# Patient Record
Sex: Male | Born: 1995 | Race: Black or African American | Hispanic: No | Marital: Single | Smoking: Never smoker
Health system: Southern US, Community
[De-identification: ages and names within clinical notes are randomized; demographics above are authoritative.]

## PROBLEM LIST (undated history)

## (undated) DIAGNOSIS — I10 Essential (primary) hypertension: Secondary | ICD-10-CM

---
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
## 2021-11-04 ENCOUNTER — Encounter: Payer: Self-pay | Admitting: Pain Medicine

## 2021-11-04 ENCOUNTER — Ambulatory Visit: Payer: No Typology Code available for payment source | Attending: Pain Medicine | Admitting: Pain Medicine

## 2021-11-04 VITALS — BP 128/101 | HR 83 | Temp 98.0°F | Resp 16 | Ht 71.0 in | Wt 143.0 lb

## 2021-11-04 DIAGNOSIS — R309 Painful micturition, unspecified: Secondary | ICD-10-CM

## 2021-11-04 DIAGNOSIS — M899 Disorder of bone, unspecified: Secondary | ICD-10-CM | POA: Diagnosis not present

## 2021-11-04 DIAGNOSIS — Z202 Contact with and (suspected) exposure to infections with a predominantly sexual mode of transmission: Secondary | ICD-10-CM

## 2021-11-04 DIAGNOSIS — G894 Chronic pain syndrome: Secondary | ICD-10-CM

## 2021-11-04 DIAGNOSIS — Z789 Other specified health status: Secondary | ICD-10-CM

## 2021-11-04 DIAGNOSIS — R52 Pain, unspecified: Secondary | ICD-10-CM

## 2021-11-04 DIAGNOSIS — R109 Unspecified abdominal pain: Secondary | ICD-10-CM

## 2021-11-04 DIAGNOSIS — N5082 Scrotal pain: Secondary | ICD-10-CM

## 2021-11-04 DIAGNOSIS — R102 Pelvic and perineal pain unspecified side: Secondary | ICD-10-CM

## 2021-11-04 DIAGNOSIS — Z79899 Other long term (current) drug therapy: Secondary | ICD-10-CM

## 2021-11-04 NOTE — Progress Notes (Signed)
Patient: Glenn Moyer  Service Category: E/M  Provider: Gaspar Cola, MD  DOB: 02-May-1995  DOS: 11/04/2021  Referring Provider: Chaney Moyer  MRN: [id number]  Setting: Ambulatory outpatient  PCP: Glenn Inches, PA-C  Type: New Patient  Specialty: Interventional Pain Management    Location: Office  Delivery: Face-to-face     Primary Reason(s) for Visit: Encounter for initial evaluation of one or more chronic problems (new to examiner) potentially causing chronic pain, and posing a threat to normal musculoskeletal function. (Level of risk: High) CC: Other (Genitalia ) and Abdominal Pain (Right and left pain )  HPI  Glenn Moyer is a 26 y.o. year old, male patient, who comes for the first time to our practice referred by Glenn Inches, PA-C for our initial evaluation of his chronic pain. 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 Today he comes in for evaluation of his Other (Genitalia ) and Abdominal Pain (Right and left pain )  Pain Assessment: Location:   Other (Comment) (genitalia) Radiating: denies Onset: More than a month ago Duration: Chronic pain Quality: Discomfort, Constant Severity: 2 /10 (subjective, self-reported pain score)  Effect on ADL: burning sensation after urination Timing: Constant Modifying factors: denies BP: (!) 128/101 (did not take BP medicine this a.m.)  HR: 83  Onset and Duration: Present longer than 3 months Cause of pain:  none listed Severity: Getting worse, NAS-11 at its worse: 7/10, NAS-11 at its best: 2/10, NAS-11 now: 3/10, and NAS-11 on the average: 3/10 Timing: Not influenced by the time of the day Aggravating Factors:  Intercourse (sex) and Motion Alleviating Factors:  none listed Associated Problems: Pain that wakes patient up Quality of Pain: Aching, Annoying, Burning, and Constant Previous Examinations or Tests: The patient denies test Previous Treatments: The patient denies treatments  According to the patient he contracted an STI/STD around January/February 2023.  According to the patient he was initially diagnosed with chlamydia and was given some antibiotics for it.  However the pain would not go away and follow-up evaluation revealed that he apparently had a secondary STI.  He was again provided with some antibiotics which according to him to he took for almost 2 months.  On follow-up he was told that he no longer had the infection but he continued to have pain on urination.  He describes this pain as a burning sensation.  He was placed on gabapentin 300 mg p.o. 3 times daily and he refers that he has been on it for approximately 1 to 2 months.  A testicular ultrasound revealed no lesions, no hernia, and did describe the scrotum as being within normal limits.  However it also showed a moderate left varicocele and a small right varicocele.  In addition it also showed a bilateral small hydroceles.  This ultrasound was done on 02/05/2021.  A CT of the abdomen and pelvis also done on 02/05/2021 without contrast revealed no acute abnormalities in the abdomen or pelvis.  In addition to the above, the patient does have a history of hypertension, migraines, decrease range of motion of his knee, and plantar fasciitis.  He was referred to our service by a urologist from the [place].  Glenn Moyer was informed that I continue to offer evaluations  and recommendations for medication management but I no longer take patients to write for their medications. I informed him that this visit is an evaluation only and that on the follow up appointment I will go over the my review of the case, the results of available tests, and assuming  that there are no contraindications, we will provide him with information about possible interventional pain management options. At that time he will have the opportunity to decide whether or not to proceed with those therapies. In the event that Glenn Moyer decides not to go with those options, or prefers to stay away from interventional therapies, this will conclude our involvement in the case.   Historic Controlled Substance Pharmacotherapy Review  PMP and historical list of controlled substances: None Most recently prescribed opioid analgesics:   None MME/day: 0 mg/day  Historical Monitoring: The patient  reports no history of drug use. List of prior UDS Testing: No results found for: "MDMA", "COCAINSCRNUR", "PCPSCRNUR", "PCPQUANT", "CANNABQUANT", "THCU", "ETH", "CBDTHCR", "D8THCCBX", "D9THCCBX" Historical Background Evaluation: [place] PMP: PDMP reviewed during this encounter. Review of the past 41-month conducted.             PMP NARX Score Report:  Narcotic: 000 Sedative: 000 Stimulant: 000 [place] Department of public safety, offender search: (Editor, commissioningInformation) Non-contributory Risk Assessment Profile: Aberrant behavior: None observed or detected today Risk factors for fatal opioid overdose: None identified today PMP NARX Overdose Risk Score: 000 Fatal overdose hazard ratio (HR): Calculation deferred Non-fatal overdose hazard ratio (HR): Calculation deferred Risk of opioid abuse or dependence: 0.7-3.0% with doses ? 36 MME/day and 6.1-26% with doses ? 120 MME/day. Substance use disorder (SUD) risk level: See below Personal History of Substance Abuse (SUD-Substance use disorder):  Alcohol: Negative  Illegal Drugs: Negative  Rx Drugs: Negative  ORT Risk Level calculation: Low Risk  Opioid Risk Tool - 11/04/21 1248       Family History of Substance Abuse   Alcohol Negative    Illegal Drugs Negative    Rx Drugs Negative      Personal History of Substance Abuse   Alcohol Negative     Illegal Drugs Negative    Rx Drugs Negative      Psychological Disease   Psychological Disease Negative    Depression Negative      Total Score   Opioid Risk Tool Scoring 0    Opioid Risk Interpretation Low Risk            ORT Scoring interpretation table:  Score <3 = Low Risk for SUD  Score between 4-7 = Moderate Risk for SUD  Score >8 = High Risk for Opioid Abuse   PHQ-2 Depression Scale:  Total score:    PHQ-2 Scoring interpretation table: (Score and probability of major depressive disorder)  Score 0 = No depression  Score 1 = 15.4% Probability  Score 2 = 21.1% Probability  Score 3 = 38.4% Probability  Score 4 = 45.5% Probability  Score 5 = 56.4% Probability  Score 6 = 78.6% Probability   PHQ-9 Depression Scale:  Total score:    PHQ-9 Scoring interpretation table:  Score 0-4 = No depression  Score 5-9 = Mild depression  Score 10-14 = Moderate depression  Score 15-19 = Moderately severe depression  Score 20-27 = Severe depression (2.4 times higher risk of SUD and 2.89 times higher risk of overuse)   Pharmacologic Plan: As per protocol, I have not taken over any controlled substance management, pending the results of ordered tests  and/or consults.            Initial impression: Pending review of available data and ordered tests.  Meds   Current Outpatient Medications:    amLODipine (NORVASC) 10 MG tablet, Take 10 mg by mouth daily., Disp: , Rfl:    SUMAtriptan (IMITREX) 25 MG tablet, Take 25 mg by mouth every 2 (two) hours as needed for migraine. May repeat in 2 hours if headache persists or recurs., Disp: , Rfl:   Imaging Review   Complexity Note: Imaging results reviewed.                         ROS  Cardiovascular: High blood pressure Pulmonary or Respiratory: No reported pulmonary signs or symptoms such as wheezing and difficulty taking a deep full breath (Asthma), difficulty blowing air out (Emphysema), coughing up mucus (Bronchitis), persistent dry  cough, or temporary stoppage of breathing during sleep Neurological: No reported neurological signs or symptoms such as seizures, abnormal skin sensations, urinary and/or fecal incontinence, being born with an abnormal open spine and/or a tethered spinal cord Psychological-Psychiatric: No reported psychological or psychiatric signs or symptoms such as difficulty sleeping, anxiety, depression, delusions or hallucinations (schizophrenial), mood swings (bipolar disorders) or suicidal ideations or attempts Gastrointestinal: No reported gastrointestinal signs or symptoms such as vomiting or evacuating blood, reflux, heartburn, alternating episodes of diarrhea and constipation, inflamed or scarred liver, or pancreas or irrregular and/or infrequent bowel movements Genitourinary: No reported renal or genitourinary signs or symptoms such as difficulty voiding or producing urine, peeing blood, non-functioning kidney, kidney stones, difficulty emptying the bladder, difficulty controlling the flow of urine, or chronic kidney disease Hematological: No reported hematological signs or symptoms such as prolonged bleeding, low or poor functioning platelets, bruising or bleeding easily, hereditary bleeding problems, low energy levels due to low hemoglobin or being anemic Endocrine: No reported endocrine signs or symptoms such as high or low blood sugar, rapid heart rate due to high thyroid levels, obesity or weight gain due to slow thyroid or thyroid disease Rheumatologic: No reported rheumatological signs and symptoms such as fatigue, joint pain, tenderness, swelling, redness, heat, stiffness, decreased range of motion, with or without associated rash Musculoskeletal: Negative for myasthenia gravis, muscular dystrophy, multiple sclerosis or malignant hyperthermia Work History: Working full time  Allergies  Mr. Kirchgessner has No Known Allergies.  Laboratory Chemistry Profile   Renal No results found for: "BUN",  "CREATININE", "LABCREA", "BCR", "GFR", "GFRAA", "GFRNONAA", "SPECGRAV", "PHUR", "PROTEINUR"   Electrolytes No results found for: "NA", "K", "CL", "CALCIUM", "MG", "PHOS"   Hepatic No results found for: "AST", "ALT", "ALBUMIN", "ALKPHOS", "AMYLASE", "LIPASE", "AMMONIA"   ID No results found for: "LYMEIGGIGMAB", "HIV", "SARSCOV2NAA", "STAPHAUREUS", "MRSAPCR", "HCVAB", "PREGTESTUR", "RMSFIGG", "QFVRPH1IGG", "QFVRPH2IGG"   Bone No results found for: "VD25OH", "VD125OH2TOT", "[id number]", "LE7517GY1", "25OHVITD1", "25OHVITD2", "25OHVITD3", "TESTOFREE", "TESTOSTERONE"   Endocrine No results found for: "GLUCOSE", "GLUCOSEU", "HGBA1C", "TSH", "FREET4", "TESTOFREE", "TESTOSTERONE", "SHBG", "ESTRADIOL", "ESTRADIOLPCT", "ESTRADIOLFRE", "LABPREG", "ACTH", "CRTSLPL", "UCORFRPERLTR", "UCORFRPERDAY", "CORTISOLBASE"   Neuropathy No results found for: "VITAMINB12", "FOLATE", "HGBA1C", "HIV"   CNS No results found for: "COLORCSF", "APPEARCSF", "RBCCOUNTCSF", "WBCCSF", "POLYSCSF", "LYMPHSCSF", "EOSCSF", "PROTEINCSF", "GLUCCSF", "JCVIRUS", "CSFOLI", "IGGCSF", "LABACHR", "ACETBL"   Inflammation (CRP: Acute  ESR: Chronic) No results found for: "CRP", "ESRSEDRATE", "LATICACIDVEN"   Rheumatology No results found for: "RF", "ANA", "LABURIC", "URICUR", "LYMEIGGIGMAB", "LYMEABIGMQN", "HLAB27"   Coagulation No results found for: "INR", "LABPROT", "APTT", "PLT", "DDIMER", "LABHEMA", "VITAMINK1", "AT3"   Cardiovascular No results found for: "BNP", "CKTOTAL", "CKMB", "TROPONINI", "HGB", "  HCT", "LABVMA", "EPIRU", "EPINEPH24HUR", "NOREPRU", "NOREPI24HUR", "DOPARU", "DOPAM24HRUR"   Screening No results found for: "SARSCOV2NAA", "COVIDSOURCE", "STAPHAUREUS", "MRSAPCR", "HCVAB", "HIV", "PREGTESTUR"   Cancer No results found for: "CEA", "CA125", "LABCA2"   Allergens No results found for: "ALMOND", "APPLE", "ASPARAGUS", "AVOCADO", "BANANA", "BARLEY", "BASIL", "BAYLEAF", "GREENBEAN", "LIMABEAN", "WHITEBEAN", "BEEFIGE",  "REDBEET", "BLUEBERRY", "BROCCOLI", "CABBAGE", "MELON", "CARROT", "CASEIN", "CASHEWNUT", "CAULIFLOWER", "CELERY"     Note: Lab results reviewed.  [place]  Drug: Mr. Crago  reports no history of drug use. Alcohol:  reports no history of alcohol use. Tobacco:  reports that he has never smoked. He has never used smokeless tobacco. Medical:  has no past medical history on file. Family: family history is not on file.  History reviewed. No pertinent surgical history. 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 In no apparent acute distress Vitals:   11/04/21 1242  BP: (!) 128/101  Pulse: 83  Resp: 16  Temp: 98 F (36.7 C)  TempSrc: Temporal  SpO2: 100%  Weight: 143 lb (64.9 kg)  Height: _0  (1.803 m)   BMI Assessment: Estimated body mass index is 19.94 kg/m as calculated from the following:   Height as of this encounter: _1  (1.803 m).   Weight as of this encounter: 143 lb (64.9 kg).  BMI interpretation table: BMI level Category Range association with higher incidence of chronic pain  <18 kg/m2 Underweight   18.5-24.9 kg/m2 Ideal body weight   25-29.9 kg/m2 Overweight Increased incidence by 20%  30-34.9 kg/m2 Obese (Class I) Increased incidence by 68%  35-39.9 kg/m2 Severe obesity (Class  II) Increased incidence by 136%  >40 kg/m2 Extreme obesity (Class III) Increased incidence by 254%   Patient's current BMI Ideal Body weight  Body mass index is 19.94 kg/m. Ideal body weight: 75.3 kg (166 lb 0.1 oz)   BMI Readings from Last 4 Encounters:  11/04/21 19.94 kg/m   Wt Readings from Last 4 Encounters:  11/04/21 143 lb (64.9 kg)    Psych/Mental status: Alert, oriented x 3 (person, place, & time)       Eyes: PERLA Respiratory: No evidence of acute respiratory distress  Assessment  Primary Diagnosis & Pertinent Problem List: The primary encounter diagnosis was Chronic pain syndrome. 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  Visit Diagnosis (New problems to examiner): 1. Chronic pain syndrome   2. Pharmacologic therapy   3. Disorder of skeletal system   4. Problems influencing health status   5. Exposure to sexually transmitted disease (STD)   6. Scrotal pain (Bilateral)   7. Burning pain (on urination)   8. Pain on  voiding   9. Pelvic pain in male   40. Combined abdominal and pelvic pain   11. 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 Harker was reminded that as per protocol, today's visit has been an evaluation only. We have not taken over the patient's controlled substance management.  Problem-specific plan: No problem-specific Assessment & Plan notes found for this encounter.  Lab Orders         Chlamydia/NGC rt PCR ([place] only)         Compliance Drug Analysis, Ur         Comp. Metabolic Panel (12)         Magnesium         Vitamin B12         Sedimentation rate         25-Hydroxy vitamin D Lcms D2+D3          C-reactive protein         RPR         HIV Antibody (routine testing w rflx)     Imaging Orders  No imaging studies ordered today   Referral Orders  No referral(s) requested today   Procedure Orders    No procedure(s) ordered today   Pharmacotherapy (current): Medications ordered:  No orders of the defined types were placed in this encounter.  Medications administered during this visit: Nyko D. Russomanno had no medications administered during this visit.   Analgesic Pharmacotherapy:  Opioid Analgesics: For patients currently taking or requesting to take opioid analgesics, in accordance with [place], we will assess their risks and indications for the use of these substances. After completing our evaluation, we may offer recommendations, but we no longer take patients for medication management. The prescribing physician will ultimately decide, based on his/her training and level of comfort whether to adopt any of the recommendations, including whether or not to prescribe such medicines.  Membrane stabilizer: To be determined at a later time  Muscle relaxant: To be determined at a later time  NSAID: To be determined at a later time  Other analgesic(s): To be determined at a later time   Interventional management options: Mr. Schaumburg was informed that there is no guarantee that he would be a candidate for interventional therapies. The decision will be based on the results of diagnostic studies, as well as Mr. Abila's risk profile.  Procedure(s) under consideration:  Pending results of ordered studies      Interventional Therapies  Risk  Complexity Considerations:   Estimated body mass index is 19.94 kg/m as calculated from the following:   Height as of this encounter: _0  (1.803 m).   Weight as of this encounter: 143 lb (64.9 kg). WNL   Planned  Pending:   See above for possible orders   Under consideration:   Pending completion  of evaluation   Completed:   None at this time   Completed by other providers:   None at this time   Therapeutic  Palliative (PRN) options:   None established      Provider-requested follow-up: Return for (16mn), Eval-day (M,W), (F2F), 2nd Visit, for review of ordered tests.  No future appointments.   Note by: FGaspar Cola MD Date: 11/04/2021; Time: 1:45 PM

## 2021-11-04 NOTE — Patient Instructions (Signed)
____________________________________________________________________________________________  New Patients  Welcome to [facility].   Initial Visit The first or initial visit consists of an evaluation only.   Interventional pain management.  We offer therapies other than opioid controlled substances to manage chronic pain. These include, but are not limited to, diagnostic, therapeutic, and palliative specialized injection therapies (i.e.: Epidural Steroids, Facet Blocks, etc.). We specialize in a variety of nerve blocks as well as radiofrequency treatments. We offer pain implant evaluations and trials, as well as follow up management. In addition we also provide a variety joint injections, including Viscosupplementation (AKA: Gel Therapy).  Prescription Pain Medication We provide evaluations for/of pharmacologic therapies. Recommendations will follow CDC Guidelines.  We no longer take patients for long-term medication management. We will not be taking over your pain medications.  ____________________________________________________________________________________________    ____________________________________________________________________________________________  Patient Information update  To: All of our patients.  Re: Name change.  It has been made official that our current name, "[facility]"   will soon be changed to "[facility]".   The purpose of this change is to eliminate any confusion created by the concept of our practice being a "Medication Management Pain Clinic". In the past this has led to the misconception that we treat pain primarily by the use of prescription medications.  Nothing can be farther from the truth.   Understanding PAIN MANAGEMENT: To further understand what our practice does, you  first have to understand that "Pain Management" is a subspecialty that requires additional training once a physician has completed their specialty training, which can be in either Anesthesia, Neurology, Psychiatry, or Physical Medicine and Rehabilitation (PMR). Each one of these contributes to the final approach taken by each physician to the management of their patient's pain. To be a "Pain Management Specialist" you must have first completed one of the specialty trainings below.  Anesthesiologists - trained in clinical pharmacology and interventional techniques such as nerve blockade and regional as well as central neuroanatomy. They are trained to block pain before, during, and after surgical interventions.  Neurologists - trained in the diagnosis and pharmacological treatment of complex neurological conditions, such as Multiple Sclerosis, Parkinson's, spinal cord injuries, and other systemic conditions that may be associated with symptoms that may include but are not limited to pain. They tend to rely primarily on the treatment of chronic pain using prescription medications.  Psychiatrist - trained in conditions affecting the psychosocial wellbeing of patients including but not limited to depression, anxiety, schizophrenia, personality disorders, addiction, and other substance use disorders that may be associated with chronic pain. They tend to rely primarily on the treatment of chronic pain using prescription medications.   Physical Medicine and Rehabilitation (PMR) physicians, also known as physiatrists - trained to treat a wide variety of medical conditions affecting the brain, spinal cord, nerves, bones, joints, ligaments, muscles, and tendons. Their training is primarily aimed at treating patients that have suffered injuries that have caused severe physical impairment. Their training is primarily aimed at the physical therapy and rehabilitation of those patients. They may also work alongside  orthopedic surgeons or neurosurgeons using their expertise in assisting surgical patients to recover after their surgeries.  INTERVENTIONAL PAIN MANAGEMENT is sub-subspecialty of Pain Management.  Our physicians are Board-certified in Anesthesia, Pain Management, and Interventional Pain Management.  This meaning that not only have they been trained and Board-certified in their specialty of Anesthesia, and   subspecialty of Pain Management, but they have also received further training in the sub-subspecialty of Interventional Pain Management, in order to become Board-certified as INTERVENTIONAL PAIN MANAGEMENT SPECIALIST.    Mission: Our goal is to use our skills in  INTERVENTIONAL PAIN MANAGEMENT as alternatives to the chronic use of prescription opioid medications for the treatment of pain. To make this more clear, we have changed our name to reflect what we do and offer. We will continue to offer medication management assessment and recommendations, but we will not be taking over any patient's medication management.  ____________________________________________________________________________________________     

## 2021-11-04 NOTE — Progress Notes (Signed)
Safety precautions to be maintained throughout the outpatient stay will include: orient to surroundings, keep bed in low position, maintain call bell within reach at all times, provide assistance with transfer out of bed and ambulation.  

## 2021-11-30 LAB — COMPLIANCE DRUG ANALYSIS, UR

## 2021-12-02 ENCOUNTER — Other Ambulatory Visit: Payer: Self-pay | Admitting: Pain Medicine

## 2021-12-02 DIAGNOSIS — E559 Vitamin D deficiency, unspecified: Secondary | ICD-10-CM

## 2021-12-02 LAB — COMP. METABOLIC PANEL (12)
AST: 25 IU/L (ref 0–40)
Albumin/Globulin Ratio: 2 (ref 1.2–2.2)
Albumin: 4.8 g/dL (ref 4.3–5.2)
Alkaline Phosphatase: 47 IU/L (ref 44–121)
BUN/Creatinine Ratio: 16 (ref 9–20)
BUN: 13 mg/dL (ref 6–20)
Bilirubin Total: 0.6 mg/dL (ref 0.0–1.2)
Calcium: 9.6 mg/dL (ref 8.7–10.2)
Chloride: 106 mmol/L (ref 96–106)
Creatinine, Ser: 0.83 mg/dL (ref 0.76–1.27)
Globulin, Total: 2.4 g/dL (ref 1.5–4.5)
Glucose: 86 mg/dL (ref 70–99)
Potassium: 4.5 mmol/L (ref 3.5–5.2)
Sodium: 144 mmol/L (ref 134–144)
Total Protein: 7.2 g/dL (ref 6.0–8.5)
eGFR: 125 mL/min/{1.73_m2} (ref >59)

## 2021-12-02 LAB — MAGNESIUM: Magnesium: 1.8 mg/dL (ref 1.6–2.3)

## 2021-12-02 LAB — SEDIMENTATION RATE: Sed Rate: 2 mm/hr (ref 0–15)

## 2021-12-02 LAB — 25-HYDROXY VITAMIN D LCMS D2+D3
25-Hydroxy, Vitamin D-2: <1 ng/mL
25-Hydroxy, Vitamin D-3: 6.3 ng/mL
25-Hydroxy, Vitamin D: 6.5 ng/mL — ABNORMAL LOW

## 2021-12-02 LAB — C-REACTIVE PROTEIN: CRP: <1 mg/L (ref 0–10)

## 2021-12-02 LAB — VITAMIN B12: Vitamin B-12: 600 pg/mL (ref 232–1245)

## 2021-12-02 MED ORDER — VITAMIN D3 125 MCG (5000 UT) PO CAPS: 1.0000 | ORAL_CAPSULE | Freq: Every day | ORAL | 2 refills | Status: AC

## 2021-12-02 MED ORDER — ERGOCALCIFEROL 1.25 MG (50000 UT) PO CAPS: 50000.0000 [IU] | ORAL_CAPSULE | ORAL | 0 refills | Status: AC

## 2022-02-09 NOTE — Progress Notes (Unsigned)
PROVIDER NOTE: Information contained herein reflects review and annotations entered in association with encounter. Interpretation of such information and data should be left to medically-trained personnel. Information provided to patient can be located elsewhere in the medical record under "Patient Instructions". Document created using STT-dictation technology, any transcriptional errors that may result from process are unintentional.    Patient: Glenn Moyer  Service Category: E/M  Provider: Gaspar Cola, MD  DOB: 01/14/1995  DOS: 02/12/2022  Referring Provider: Chaney Malling  MRN: [id number]  Specialty: Interventional Pain Management  PCP: Phineas Inches, PA-C  Type: Established Patient  Setting: Ambulatory outpatient    Location: Office  Delivery: Face-to-face     Primary Reason(s) for Visit: Encounter for evaluation before starting new chronic pain management plan of care (Level of risk: moderate) CC: No chief complaint on file.  HPI  Glenn Moyer is a 27 y.o. year old, male patient, who comes today for a follow-up evaluation to review the test results and decide on a treatment plan. 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 His primarily concern today is the No chief complaint on file.  Pain Assessment: Location:     Radiating:   Onset:   Duration:   Quality:   Severity:  /10 (subjective, self-reported pain score)  Effect on ADL:   Timing:   Modifying factors:   BP:    HR:    Glenn Moyer comes in today for a follow-up visit after his initial evaluation on 11/04/2021. Today we went over the results of his tests. These were explained  in "Layman's terms". During today's appointment we went over my diagnostic impression, as well as the proposed treatment plan.  ***:"According to the patient he contracted an STI/STD around January/February 2023.  According to the patient he was initially diagnosed with chlamydia and was given some antibiotics for it.  However the pain would not go away and follow-up evaluation revealed that he apparently had a secondary STI.  He was again provided with some antibiotics which according to him to he took for almost 2 months.  On follow-up he was told that he no longer had the infection but he continued to have pain on urination.  He describes this pain as a burning sensation.  He was placed on gabapentin 300 mg p.o. 3 times daily and he refers that he has been on it for approximately 1 to 2 months.  A testicular ultrasound revealed no lesions, no hernia, and did describe the scrotum as being within normal limits.  However it also showed a moderate left varicocele and a small right varicocele.  In addition it also showed a bilateral small hydroceles.  This ultrasound was done on 02/05/2021.  A CT of the abdomen and pelvis also done on 02/05/2021 without contrast revealed no acute abnormalities in the abdomen or pelvis.   In addition to the above, the patient does have a history of hypertension, migraines, decrease range of motion of his knee, and plantar fasciitis.  He was referred to our service by a urologist from the [facility]."  ***  Patient presented with interventional treatment options. Glenn Moyer was informed that I will not be providing medication management. Pharmacotherapy evaluation including recommendations may be offered,  if specifically requested.   Controlled Substance Pharmacotherapy Assessment REMS (Risk Evaluation and Mitigation Strategy)  Opioid Analgesic: None MME/day: 0 mg/day  Pill Count: None expected due to no prior prescriptions written by our practice. No notes on  file Pharmacokinetics: Liberation and absorption (onset of action): WNL Distribution (time to peak effect): WNL Metabolism and excretion (duration of action): WNL         Pharmacodynamics: Desired effects: Analgesia: Glenn Moyer reports >50% benefit. Functional ability: Patient reports that medication allows him to accomplish basic ADLs Clinically meaningful improvement in function (CMIF): Sustained CMIF goals met Perceived effectiveness: Described as relatively effective, allowing for increase in activities of daily living (ADL) Undesirable effects: Side-effects or Adverse reactions: None reported Monitoring: [place] PMP: PDMP reviewed during this encounter. Online review of the past 53-month period previously conducted. Not applicable at this point since we have not taken over the patient's medication management yet. List of other Serum/Urine Drug Screening Test(s):  No results found for: "AMPHSCRSER", "BARBSCRSER", "BENZOSCRSER", "COCAINSCRSER", "COCAINSCRNUR", "PCPSCRSER", "THCSCRSER", "THCU", "CANNABQUANT", "OPIATESCRSER", "OXYSCRSER", "PROPOXSCRSER", "ETH", "CBDTHCR", "D8THCCBX", "D9THCCBX" List of all UDS test(s) done:  Lab Results  Component Value Date   SUMMARY Note 11/26/2021   Last UDS on record: Summary  Date Value Ref Range Status  11/26/2021 Note  Final    Comment:    ==================================================================== Compliance Drug Analysis, Ur ==================================================================== Test                             Result       Flag       Units    NO DRUGS DETECTED. ==================================================================== Test                      Result    Flag   Units      Ref Range   Creatinine              217              mg/dL      >=28 ==================================================================== Declared Medications:  The flagging and interpretation on this report are based on the  following  declared medications.  Unexpected results may arise from  inaccuracies in the declared medications.   **Note: The testing scope of this panel does not include the  following reported medications:   Amlodipine (Norvasc)  Sumatriptan (Imitrex) ==================================================================== For clinical consultation, please call [phone]. ====================================================================    UDS interpretation: No unexpected findings.          Medication Assessment Form: Not applicable. No opioids. Treatment compliance: Not applicable Risk Assessment Profile: Aberrant behavior: See initial evaluations. None observed or detected today Comorbid factors increasing risk of overdose: See initial evaluation. No additional risks detected today Opioid risk tool (ORT):     11/04/2021   12:48 PM  Opioid Risk   Alcohol 0  Illegal Drugs 0  Rx Drugs 0  Alcohol 0  Illegal Drugs 0  Rx Drugs 0  Psychological Disease 0  Depression 0  Opioid Risk Tool Scoring 0  Opioid Risk Interpretation Low Risk    ORT Scoring interpretation table:  Score <3 = Low Risk for SUD  Score between 4-7 = Moderate Risk for SUD  Score >8 = High Risk for Opioid Abuse   Risk of substance use disorder (SUD): Low  Risk Mitigation Strategies:  Patient opioid safety counseling: No controlled substances prescribed. Patient-Prescriber Agreement (PPA): No agreement signed.  Controlled  substance notification to other providers: None required. No opioid therapy.  Pharmacologic Plan: Non-opioid analgesic therapy offered. Interventional alternatives discussed.             Laboratory Chemistry Profile   Renal Lab Results  Component Value Date   BUN 13 11/26/2021   CREATININE 0.83 11/26/2021   BCR 16 11/26/2021     Electrolytes Lab Results  Component Value Date   NA 144 11/26/2021   K 4.5 11/26/2021   CL 106 11/26/2021   CALCIUM 9.6 11/26/2021   MG 1.8 11/26/2021      Hepatic Lab Results  Component Value Date   AST 25 11/26/2021   ALBUMIN 4.8 11/26/2021   ALKPHOS 47 11/26/2021     ID No results found for: "LYMEIGGIGMAB", "HIV", "SARSCOV2NAA", "STAPHAUREUS", "MRSAPCR", "HCVAB", "PREGTESTUR", "RMSFIGG", "QFVRPH1IGG", "QFVRPH2IGG"   Bone Lab Results  Component Value Date   25OHVITD1 6.5 (L) 11/26/2021   25OHVITD2 <1.0 11/26/2021   25OHVITD3 6.3 11/26/2021     Endocrine Lab Results  Component Value Date   GLUCOSE 86 11/26/2021     Neuropathy Lab Results  Component Value Date   VITAMINB12 600 11/26/2021     CNS No results found for: "COLORCSF", "APPEARCSF", "RBCCOUNTCSF", "WBCCSF", "POLYSCSF", "LYMPHSCSF", "EOSCSF", "PROTEINCSF", "GLUCCSF", "JCVIRUS", "CSFOLI", "IGGCSF", "LABACHR", "ACETBL"   Inflammation (CRP: Acute  ESR: Chronic) Lab Results  Component Value Date   CRP <1 11/26/2021   ESRSEDRATE 2 11/26/2021     Rheumatology No results found for: "RF", "ANA", "LABURIC", "URICUR", "LYMEIGGIGMAB", "LYMEABIGMQN", "HLAB27"   Coagulation No results found for: "INR", "LABPROT", "APTT", "PLT", "DDIMER", "LABHEMA", "VITAMINK1", "AT3"   Cardiovascular No results found for: "BNP", "CKTOTAL", "CKMB", "TROPONINI", "HGB", "HCT", "LABVMA", "EPIRU", "EPINEPH24HUR", "NOREPRU", "NOREPI24HUR", "DOPARU", "DOPAM24HRUR"   Screening No results found for: "SARSCOV2NAA", "COVIDSOURCE", "STAPHAUREUS", "MRSAPCR", "HCVAB", "HIV", "PREGTESTUR"   Cancer No results found for: "CEA", "CA125", "LABCA2"   Allergens No results found for: "ALMOND", "APPLE", "ASPARAGUS", "AVOCADO", "BANANA", "BARLEY", "BASIL", "BAYLEAF", "GREENBEAN", "LIMABEAN", "WHITEBEAN", "BEEFIGE", "REDBEET", "BLUEBERRY", "BROCCOLI", "CABBAGE", "MELON", "CARROT", "CASEIN", "CASHEWNUT", "CAULIFLOWER", "CELERY"     Note: Lab results reviewed.  Recent Diagnostic Imaging Review   Complexity Note: Imaging results reviewed.                         Meds   Current Outpatient  Medications:    amLODipine (NORVASC) 10 MG tablet, Take 10 mg by mouth daily., Disp: , Rfl:    Cholecalciferol (VITAMIN D3) 125 MCG (5000 UT) CAPS, Take 1 capsule (5,000 Units total) by mouth daily with breakfast. Take along with calcium and magnesium., Disp: 30 capsule, Rfl: 2   SUMAtriptan (IMITREX) 25 MG tablet, Take 25 mg by mouth every 2 (two) hours as needed for migraine. May repeat in 2 hours if headache persists or recurs., Disp: , Rfl:   ROS  Constitutional: Denies any fever or chills Gastrointestinal: No reported hemesis, hematochezia, vomiting, or acute GI distress Musculoskeletal: Denies any acute onset joint swelling, redness, loss of ROM, or weakness Neurological: No reported episodes of acute onset apraxia, aphasia, dysarthria, agnosia, amnesia, paralysis, loss of coordination, or loss of consciousness  Allergies  Glenn Moyer has No Known Allergies.  [place]  Drug: Glenn Moyer  reports no history of drug use. Alcohol:  reports no history of alcohol use. Tobacco:  reports that he has never smoked. He has never used smokeless tobacco. Medical:  has no past medical history on file. Surgical: Glenn Moyer  has no past surgical history on file.  Family: family history is not on file.  Constitutional Exam  General appearance: Well nourished, well developed, and well hydrated. In no apparent acute distress There were no vitals filed for this visit. BMI Assessment: Estimated body mass index is 19.94 kg/m as calculated from the following:   Height as of 11/04/21: 5\' 11"  (1.803 m).   Weight as of 11/04/21: 143 lb (64.9 kg).  BMI interpretation table: BMI level Category Range association with higher incidence of chronic pain  <18 kg/m2 Underweight   18.5-24.9 kg/m2 Ideal body weight   25-29.9 kg/m2 Overweight Increased incidence by 20%  30-34.9 kg/m2 Obese (Class I) Increased incidence by 68%  35-39.9 kg/m2 Severe obesity (Class II) Increased incidence by 136%  >40 kg/m2  Extreme obesity (Class III) Increased incidence by 254%   Patient's current BMI Ideal Body weight  There is no height or weight on file to calculate BMI. Patient weight not recorded   BMI Readings from Last 4 Encounters:  11/04/21 19.94 kg/m   Wt Readings from Last 4 Encounters:  11/04/21 143 lb (64.9 kg)    Psych/Mental status: Alert, oriented x 3 (person, place, & time)       Eyes: PERLA Respiratory: No evidence of acute respiratory distress  Assessment & Plan  Primary Diagnosis & Pertinent Problem List: The primary encounter diagnosis was Chronic pain syndrome. 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  Visit Diagnosis: 1. Chronic pain syndrome   2. Burning pain (on urination)   3. Combined abdominal and pelvic pain   4. 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 Pain on voiding   6. Pelvic pain in male   7. Scrotal pain (Bilateral)   8. Vitamin D deficiency    Problems updated and reviewed during this visit: No problems updated.  Plan of Care  Pharmacotherapy (Medications Ordered): No orders of the defined types were placed in this encounter.  Procedure Orders    No procedure(s) ordered today   Lab Orders  No laboratory test(s) ordered today   Imaging Orders  No imaging studies ordered today   Referral Orders  No referral(s) requested today    Pharmacological management:  Opioid Analgesics: I will not be prescribing any opioids at this time Membrane stabilizer: I will not be prescribing any at this time Muscle relaxant: I will not be prescribing any at this time NSAID: I will not be prescribing any at this time Other analgesic(s): I will not be prescribing any at this time      Interventional Therapies  Risk  Complexity Considerations:   Estimated body mass  index is 19.94 kg/m as calculated from the following:   Height as of this encounter: 5\' 11"  (1.803 m).   Weight as of this encounter: 143 lb (64.9 kg). WNL   Planned  Pending:   See above for possible orders   Under consideration:   Pending completion of evaluation   Completed:   None at this time   Completed by other providers:   None at this time   Therapeutic  Palliative (PRN) options:   None established         Provider-requested follow-up: No follow-ups on file. Recent Visits No visits were found meeting these conditions. Showing recent visits within past 90 days and meeting all other requirements Future Appointments Date Type Provider Dept  02/12/22  Appointment Milinda Pointer, MD [facility]  Showing future appointments within next 90 days and meeting all other requirements   Primary Care Physician: Phineas Inches, PA-C  Duration of encounter: *** minutes.  Total time on encounter, as per AMA guidelines included both the face-to-face and non-face-to-face time personally spent by the physician and/or other qualified health care professional(s) on the day of the encounter (includes time in activities that require the physician or other qualified health care professional and does not include time in activities normally performed by clinical staff). Physician's time may include the following activities when performed: Preparing to see the patient (e.g., pre-charting review of records, searching for previously ordered imaging, lab work, and nerve conduction tests) Review of prior analgesic pharmacotherapies. Reviewing PMP Interpreting ordered tests (e.g., lab work, imaging, nerve conduction tests) Performing post-procedure evaluations, including interpretation of diagnostic procedures Obtaining and/or reviewing separately obtained history Performing a medically appropriate examination and/or evaluation Counseling and educating the  patient/family/caregiver Ordering medications, tests, or procedures Referring and communicating with other health care professionals (when not separately reported) Documenting clinical information in the electronic or other health record Independently interpreting results (not separately reported) and communicating results to the patient/ family/caregiver Care coordination (not separately reported)  Note by: Gaspar Cola, MD Date: 02/12/2022; Time: 2:25 PM

## 2022-02-12 ENCOUNTER — Encounter: Payer: Self-pay | Admitting: Pain Medicine

## 2022-02-12 ENCOUNTER — Ambulatory Visit: Payer: No Typology Code available for payment source | Attending: Pain Medicine | Admitting: Pain Medicine

## 2022-02-12 VITALS — BP 137/99 | HR 72 | Temp 97.6°F | Ht 71.0 in | Wt 143.0 lb

## 2022-02-12 DIAGNOSIS — Z7189 Other specified counseling: Secondary | ICD-10-CM | POA: Diagnosis present

## 2022-02-12 DIAGNOSIS — G894 Chronic pain syndrome: Secondary | ICD-10-CM

## 2022-02-12 DIAGNOSIS — R309 Painful micturition, unspecified: Secondary | ICD-10-CM | POA: Insufficient documentation

## 2022-02-12 DIAGNOSIS — R1032 Left lower quadrant pain: Secondary | ICD-10-CM | POA: Insufficient documentation

## 2022-02-12 DIAGNOSIS — R300 Dysuria: Secondary | ICD-10-CM | POA: Diagnosis present

## 2022-02-12 DIAGNOSIS — R109 Unspecified abdominal pain: Secondary | ICD-10-CM | POA: Insufficient documentation

## 2022-02-12 DIAGNOSIS — R52 Pain, unspecified: Secondary | ICD-10-CM

## 2022-02-12 DIAGNOSIS — N5082 Scrotal pain: Secondary | ICD-10-CM | POA: Insufficient documentation

## 2022-02-12 DIAGNOSIS — G43009 Migraine without aura, not intractable, without status migrainosus: Secondary | ICD-10-CM | POA: Diagnosis present

## 2022-02-12 DIAGNOSIS — E559 Vitamin D deficiency, unspecified: Secondary | ICD-10-CM | POA: Insufficient documentation

## 2022-02-12 DIAGNOSIS — R102 Pelvic and perineal pain: Secondary | ICD-10-CM | POA: Insufficient documentation

## 2022-02-12 DIAGNOSIS — J45909 Unspecified asthma, uncomplicated: Secondary | ICD-10-CM | POA: Insufficient documentation

## 2022-02-12 DIAGNOSIS — I10 Essential (primary) hypertension: Secondary | ICD-10-CM | POA: Diagnosis present

## 2022-02-12 DIAGNOSIS — R1031 Right lower quadrant pain: Secondary | ICD-10-CM | POA: Insufficient documentation

## 2022-02-12 MED ORDER — VITAMIN D3 125 MCG (5000 UT) PO CAPS: 1.0000 | ORAL_CAPSULE | Freq: Every day | ORAL | 2 refills | Status: AC

## 2022-02-12 MED ORDER — ERGOCALCIFEROL 1.25 MG (50000 UT) PO CAPS: 50000.0000 [IU] | ORAL_CAPSULE | ORAL | 0 refills | Status: AC

## 2022-02-12 NOTE — Patient Instructions (Signed)
Recommendations to discuss with primary care physician: Continue regimen with gabapentin (Neurontin) 3 times daily to 4 times daily as tolerated.  Dose may be increased slowly, as tolerated, to a maximum of 3200 mg/day (800 mg 4 times daily). NSAIDs may be taken.  Consider Celebrex (celecoxib) 100 mg twice daily. Consider taking phenazopyridine (Common brands: Urinary Pain Relief, Pyridium, Azo Urinary Pain Relief) (AZO is available OTC).

## 2022-03-17 ENCOUNTER — Other Ambulatory Visit: Payer: Self-pay | Admitting: Neurology

## 2022-03-17 DIAGNOSIS — G43719 Chronic migraine without aura, intractable, without status migrainosus: Secondary | ICD-10-CM

## 2022-03-26 ENCOUNTER — Ambulatory Visit
Admission: RE | Admit: 2022-03-26 | Discharge: 2022-03-26 | Disposition: A | Discharge status: Home / Self Care | Payer: No Typology Code available for payment source | Source: Ambulatory Visit | Attending: Neurology | Admitting: Neurology

## 2022-03-26 DIAGNOSIS — G43719 Chronic migraine without aura, intractable, without status migrainosus: Secondary | ICD-10-CM | POA: Diagnosis present

## 2022-03-26 MED ORDER — GADOBUTROL 1 MMOL/ML IV SOLN
6.0000 mL | Freq: Once | INTRAVENOUS | Status: AC | PRN
  Administered 2022-03-26: 6 mL via INTRAVENOUS

## 2022-04-15 ENCOUNTER — Encounter (HOSPITAL_COMMUNITY): Payer: Self-pay

## 2022-04-15 ENCOUNTER — Ambulatory Visit (HOSPITAL_COMMUNITY)
Admission: RE | Admit: 2022-04-15 | Discharge: 2022-04-15 | Disposition: A | Discharge status: Home / Self Care | Payer: No Typology Code available for payment source | Source: Ambulatory Visit

## 2022-04-15 VITALS — BP 144/92 | HR 82 | Temp 98.8°F | Resp 16

## 2022-04-15 DIAGNOSIS — L290 Pruritus ani: Secondary | ICD-10-CM | POA: Diagnosis not present

## 2022-04-15 HISTORY — DX: Essential (primary) hypertension: I10

## 2022-04-15 MED ORDER — HYDROCORTISONE (PERIANAL) 2.5 % EX CREA: 1.0000 | TOPICAL_CREAM | Freq: Two times a day (BID) | CUTANEOUS | 0 refills | Status: AC

## 2022-04-15 MED ORDER — HYDROCORTISONE ACETATE 25 MG RE SUPP: 25.0000 mg | Freq: Two times a day (BID) | RECTAL | 0 refills | Status: AC

## 2022-04-15 NOTE — ED Provider Notes (Signed)
[facility]    CSN: [id number] Arrival date & time: 04/15/22  1212      History   Chief Complaint Chief Complaint  Patient presents with   Anal Itching    HPI Glenn Moyer is a 27 y.o. male.   Patient presents for evaluation of 2 days of anal itching and 1 occurrence of a drop of blood in the toilet.  Endorses mild rectal discomfort but denies pain.  Has attempted use of ibuprofen which has been ineffective.  Sexually active but denies anal penetration.  Last bowel movement 1 day ago, does endorse straining, denies history of constipation.  Denies penile or urinary symptoms.    Past Medical History:  Diagnosis Date   Hypertension     Patient Active Problem List   Diagnosis Date Noted   Asthma without status asthmaticus 02/12/2022   Dysuria (1ry Pain) 02/12/2022   Chronic groin pain (Bilateral) 02/12/2022   Benign essential hypertension 02/12/2022   Migraine without aura and responsive to treatment 02/12/2022   Encounter for pain management counseling 02/12/2022   Vitamin D deficiency 12/02/2021   Chronic pain syndrome 11/04/2021   Pharmacologic therapy 11/04/2021   Disorder of skeletal system 11/04/2021   Problems influencing health status 11/04/2021   Exposure to sexually transmitted disease (STD) 11/04/2021   Scrotal pain (Bilateral) 11/04/2021   Burning pain (on urination) 11/04/2021   Pain on voiding 11/04/2021   Combined abdominal and pelvic pain 11/04/2021   Pelvic pain in male 11/04/2021   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 No pertinent surgical history.     Home Medications    Prior to Admission medications   Medication Sig Start Date End Date Taking? Authorizing Provider  Cholecalciferol (VITAMIN D3) 125 MCG (5000 UT) CAPS Take 1 capsule (5,000 Units total) by mouth daily with breakfast. Take along with calcium and magnesium.  02/12/22 05/13/22 Yes Delano Metz, MD  gabapentin (NEURONTIN) 300 MG capsule TAKE ONE CAPSULE BY MOUTH AT BEDTIME TAKE ONE CAPSULE AT BEDTIME FOR CHRONIC PELVIC PAIN 07/10/21 07/11/22 Yes [provider]  metoprolol succinate (TOPROL-XL) 50 MG 24 hr tablet Take 1 tablet by mouth at bedtime. 03/12/22 03/12/23 Yes [provider]  amLODipine (NORVASC) 10 MG tablet Take 10 mg by mouth daily.    [provider]  SUMAtriptan (IMITREX) 25 MG tablet Take 25 mg by mouth every 2 (two) hours as needed for migraine. May repeat in 2 hours if headache persists or recurs.    [provider]    Family History History reviewed. No pertinent family history.  Social History Social History   Tobacco Use   Smoking status: Never   Smokeless tobacco: Never  Vaping Use   Vaping Use: Never used  Substance Use Topics   Alcohol use: Never   Drug use: Never     Allergies   Patient has no known allergies.   Review of Systems Review of Systems   Physical Exam Triage Vital Signs ED Triage Vitals  Enc Vitals Group     BP 04/15/22 1233 (!) 144/92     Pulse Rate 04/15/22 1233 82     Resp 04/15/22 1233 16     Temp 04/15/22 1233 98.8 F (37.1 C)     Temp Source 04/15/22 1233 Oral     SpO2 04/15/22 1233 97 %     Weight --  Height --      Head Circumference --      Peak Flow --      Pain Score 04/15/22 1231 0     Pain Loc --      Pain Edu? --      Excl. in GC? --    No data found.  Updated Vital Signs BP (!) 144/92 (BP Location: Right Arm)   Pulse 82   Temp 98.8 F (37.1 C) (Oral)   Resp 16   SpO2 97%   Visual Acuity Right Eye Distance:   Left Eye Distance:   Bilateral Distance:    Right Eye Near:   Left Eye Near:    Bilateral Near:     Physical Exam Exam conducted with a chaperone present.  Constitutional:      Appearance: Normal appearance.  Eyes:     Extraocular Movements: Extraocular movements intact.  Pulmonary:     Effort: Pulmonary  effort is normal.  Genitourinary:    Rectum: Normal. No tenderness, anal fissure or external hemorrhoid. Normal anal tone.  Neurological:     Mental Status: He is alert and oriented to person, place, and time. Mental status is at baseline.      UC Treatments / Results  Labs (all labs ordered are listed, but only abnormal results are displayed) Labs Reviewed - No data to display  EKG   Radiology No results found.  Procedures Procedures (including critical care time)  Medications Ordered in UC Medications - No data to display  Initial Impression / Assessment and Plan / UC Course  I have reviewed the triage vital signs and the nursing notes.  Pertinent labs & imaging results that were available during my care of the patient were reviewed by me and considered in my medical decision making (see chart for details).  Anal itching  No abnormalities on exam, possible external hemorrhoid, not assessed, discussed with patient, will treat symptoms, and call cream prescribed as well as Anusol suppositories to be used if cream is ineffective, endorses straining could be causing symptoms and advised high-fiber diet with increase fluid intake with primarily water, advise follow-up with his primary doctor symptoms continue to persist or worsen Final Clinical Impressions(s) / UC Diagnoses   Final diagnoses:  None   Discharge Instructions   None    ED Prescriptions   None    PDMP not reviewed this encounter.   Valinda Hoar, NP 04/15/22 1332

## 2022-04-15 NOTE — ED Triage Notes (Signed)
Pt states he has rectal itching and irritation since Sunday he did see a small amount of blood on Sunday as well but nothing since.

## 2022-04-15 NOTE — Discharge Instructions (Signed)
Today you were evaluated for anal itching, your packet is more information on anal itching as well   on examination there were no external causes such as a fissure, hemorrhoid or tear, no current signs of infection, will attempt to treat the symptoms, it is possible that you have an internal hemorrhoid, same treatment is applied  Apply hydrocortisone cream twice daily to help manage symptoms, attempt for at least 3 days before deciding if effective, if not helpful you may try hydrocortisone suppository, placed inside your rectum and then lay on left side for at least 5 minutes to allow to dissolve, attempt for at least 3 days before deciding if effective  If no improvement seen please follow-up with your primary doctor or urgent care for reevaluation  Straining and constipation can also cause some rectal discomfort which may lead to some of your symptoms, ensure that you are not straining or having constipation when completing bowel movements, if unable to go to the bathroom easily, take a break and then retry later  To help with straining increase your fluid intake of water as well as your fiber, inside your packet is a list of high-fiber foods you may choose to incorporate into your diet

## 2022-06-23 ENCOUNTER — Ambulatory Visit
Admission: EM | Admit: 2022-06-23 | Discharge: 2022-06-23 | Disposition: A | Discharge status: Home / Self Care | Payer: No Typology Code available for payment source | Attending: Urgent Care | Admitting: Urgent Care

## 2022-06-23 DIAGNOSIS — L03011 Cellulitis of right finger: Secondary | ICD-10-CM | POA: Diagnosis not present

## 2022-06-23 MED ORDER — DOXYCYCLINE HYCLATE 100 MG PO CAPS: 100.0000 mg | ORAL_CAPSULE | Freq: Two times a day (BID) | ORAL | 0 refills | Status: AC

## 2022-06-23 MED ORDER — IBUPROFEN 600 MG PO TABS: 600.0000 mg | ORAL_TABLET | Freq: Four times a day (QID) | ORAL | 0 refills | Status: DC | PRN

## 2022-06-23 NOTE — ED Triage Notes (Signed)
Pt c/o pain to right middle finger x 1 week-swelling "turned green" x today-denies injury-NAD-steady gait

## 2022-06-23 NOTE — Discharge Instructions (Signed)
Please change your dressing 3-5 times daily. Do not apply any ointments or creams. Each time you change your dressing, make sure that you are pressing on the wound to get pus to come out.  Try your best to clean the wound with antibacterial soap and warm water. Pat your wound dry and let it air out if possible to make sure it is dry before reapplying another dressing.   Start doxycycline for the infection. Use ibuprofen for the pain.   

## 2022-06-23 NOTE — ED Provider Notes (Addendum)
[facility]  Note:  This document was prepared using Conservation officer, historic buildings and may include unintentional dictation errors.  MRN: [id number] DOB: May 14, 1995  Subjective:   Glenn Moyer is a 27 y.o. male presenting for 1 week history of persistent worsening pain over the right middle finger.  Reports that he feels there is an area that has turned green along the nailbed.  Has not had any drainage or bleeding.  Denies history of infections like abscesses.  No current facility-administered medications for this encounter.  Current Outpatient Medications:    amLODipine (NORVASC) 10 MG tablet, Take 10 mg by mouth daily., Disp: , Rfl:    gabapentin (NEURONTIN) 300 MG capsule, TAKE ONE CAPSULE BY MOUTH AT BEDTIME TAKE ONE CAPSULE AT BEDTIME FOR CHRONIC PELVIC PAIN, Disp: , Rfl:    hydrocortisone (ANUSOL-HC) 2.5 % rectal cream, Place 1 Application rectally 2 (two) times daily., Disp: 30 g, Rfl: 0   hydrocortisone (ANUSOL-HC) 25 MG suppository, Place 1 suppository (25 mg total) rectally 2 (two) times daily., Disp: 12 suppository, Rfl: 0   metoprolol succinate (TOPROL-XL) 50 MG 24 hr tablet, Take 1 tablet by mouth at bedtime., Disp: , Rfl:    SUMAtriptan (IMITREX) 25 MG tablet, Take 25 mg by mouth every 2 (two) hours as needed for migraine. May repeat in 2 hours if headache persists or recurs., Disp: , Rfl:    Allergies  Allergen Reactions   Peanut-Containing Drug Products Hives and Swelling    Past Medical History:  Diagnosis Date   Hypertension      History reviewed. No pertinent surgical history.  No family history on file.  Social History   Tobacco Use   Smoking status: Never   Smokeless tobacco: Never  Vaping Use   Vaping Use: Never used  Substance Use Topics   Alcohol use: Never   Drug use: Never    ROS   Objective:   Vitals: BP 138/89 (BP Location: Right Arm)   Pulse 73   Temp 98.2 F (36.8 C) (Oral)   Resp 16   SpO2 98%    Physical Exam Constitutional:      General: He is not in acute distress.    Appearance: Normal appearance. He is well-developed and normal weight. He is not ill-appearing, toxic-appearing or diaphoretic.  HENT:     Head: Normocephalic and atraumatic.     Right Ear: External ear normal.     Left Ear: External ear normal.     Nose: Nose normal.     Mouth/Throat:     Pharynx: Oropharynx is clear.  Eyes:     General: No scleral icterus.       Right eye: No discharge.        Left eye: No discharge.     Extraocular Movements: Extraocular movements intact.  Cardiovascular:     Rate and Rhythm: Normal rate.  Pulmonary:     Effort: Pulmonary effort is normal.  Musculoskeletal:       Hands:     Cervical back: Normal range of motion.  Neurological:     Mental Status: He is alert and oriented to person, place, and time.  Psychiatric:        Mood and Affect: Mood normal.        Behavior: Behavior normal.        Thought Content: Thought content normal.        Judgment: Judgment normal.    PROCEDURE NOTE: Paronychia I&D Verbal consent  obtained. Local anesthesia with 5cc of 1% lidocaine without lidocaine, performed a digital block and also local injection. Site cleansed with Betadine.  Paronychia expressed using 11 blade, Adson forcep, discharge 1cc mixture of purulence, serosanguineous fluid. Cleansed and dressed.  Assessment and Plan :   PDMP not reviewed this encounter.  1. Paronychia of right middle finger    Successful I&D performed.  Wound care reviewed.  Start doxycycline for the abscess, ibuprofen for pain and inflammation. Counseled patient on potential for adverse effects with medications prescribed/recommended today, ER and return-to-clinic precautions discussed, patient verbalized understanding.   Wallis Bamberg, PA-C 06/23/22 1244

## 2022-10-27 ENCOUNTER — Ambulatory Visit: Payer: Self-pay

## 2022-10-28 ENCOUNTER — Ambulatory Visit
Admission: RE | Admit: 2022-10-28 | Discharge: 2022-10-28 | Disposition: A | Discharge status: Home / Self Care | Payer: No Typology Code available for payment source | Source: Ambulatory Visit | Attending: Internal Medicine | Admitting: Internal Medicine

## 2022-10-28 VITALS — BP 146/98 | HR 80 | Temp 98.0°F | Resp 16

## 2022-10-28 DIAGNOSIS — N481 Balanitis: Secondary | ICD-10-CM | POA: Insufficient documentation

## 2022-10-28 MED ORDER — HYDROCORTISONE 1 % EX CREA: TOPICAL_CREAM | CUTANEOUS | 0 refills | Status: DC

## 2022-10-28 NOTE — ED Triage Notes (Signed)
Pt c/o penile pain x 1 week-states he feels is a "friction burn"-NAD-steady gait

## 2022-10-28 NOTE — ED Provider Notes (Signed)
[facility]  Note:  This document was prepared using Conservation officer, historic buildings and may include unintentional dictation errors.  MRN: [id number] DOB: [birth year]  Subjective:   Glenn Moyer is a 27 y.o. male presenting for 1 week history of persistent penile irritation.  No rash but he feels like he has a couple spots that he wants evaluated.  Would like STI testing.  Denies dysuria, hematuria, urinary frequency, penile discharge, penile swelling, testicular pain, testicular swelling, anal pain, groin pain.  He does have unprotected sex with females only.  No current facility-administered medications for this encounter.  Current Outpatient Medications:    amLODipine (NORVASC) 10 MG tablet, Take 10 mg by mouth daily., Disp: , Rfl:    doxycycline (VIBRAMYCIN) 100 MG capsule, Take 1 capsule (100 mg total) by mouth 2 (two) times daily., Disp: 14 capsule, Rfl: 0   gabapentin (NEURONTIN) 300 MG capsule, TAKE ONE CAPSULE BY MOUTH AT BEDTIME TAKE ONE CAPSULE AT BEDTIME FOR CHRONIC PELVIC PAIN, Disp: , Rfl:    hydrocortisone (ANUSOL-HC) 2.5 % rectal cream, Place 1 Application rectally 2 (two) times daily., Disp: 30 g, Rfl: 0   hydrocortisone (ANUSOL-HC) 25 MG suppository, Place 1 suppository (25 mg total) rectally 2 (two) times daily., Disp: 12 suppository, Rfl: 0   ibuprofen (ADVIL) 600 MG tablet, Take 1 tablet (600 mg total) by mouth every 6 (six) hours as needed., Disp: 30 tablet, Rfl: 0   metoprolol succinate (TOPROL-XL) 50 MG 24 hr tablet, Take 1 tablet by mouth at bedtime., Disp: , Rfl:    SUMAtriptan (IMITREX) 25 MG tablet, Take 25 mg by mouth every 2 (two) hours as needed for migraine. May repeat in 2 hours if headache persists or recurs., Disp: , Rfl:    Allergies  Allergen Reactions   Peanut-Containing Drug Products Hives and Swelling    Past Medical History:  Diagnosis Date   Hypertension      History reviewed. No pertinent surgical  history.  No family history on file.  Social History   Tobacco Use   Smoking status: Never   Smokeless tobacco: Never  Vaping Use   Vaping status: Never Used  Substance Use Topics   Alcohol use: Never   Drug use: Never    ROS   Objective:   Vitals: BP (!) 146/98 (BP Location: Right Arm)   Pulse 80   Temp 98 F (36.7 C) (Oral)   Resp 16   SpO2 97%   Physical Exam Constitutional:      General: He is not in acute distress.    Appearance: Normal appearance. He is well-developed and normal weight. He is not ill-appearing, toxic-appearing or diaphoretic.  HENT:     Head: Normocephalic and atraumatic.     Right Ear: External ear normal.     Left Ear: External ear normal.     Nose: Nose normal.     Mouth/Throat:     Pharynx: Oropharynx is clear.  Eyes:     General: No scleral icterus.       Right eye: No discharge.        Left eye: No discharge.     Extraocular Movements: Extraocular movements intact.  Cardiovascular:     Rate and Rhythm: Normal rate.  Pulmonary:     Effort: Pulmonary effort is normal.  Genitourinary:    Penis: Circumcised. No phimosis, paraphimosis, hypospadias, erythema, tenderness, discharge, swelling or lesions.     Musculoskeletal:     Cervical back: Normal  range of motion.  Neurological:     Mental Status: He is alert and oriented to person, place, and time.  Psychiatric:        Mood and Affect: Mood normal.        Behavior: Behavior normal.        Thought Content: Thought content normal.        Judgment: Judgment normal.     Assessment and Plan :   PDMP not reviewed this encounter.  1. Balanitis    Recommend topical hydrocortisone.  STI check pending.  Anticipatory guidance provided for [place] spots.  Counseled patient on potential for adverse effects with medications prescribed/recommended today, ER and return-to-clinic precautions discussed, patient verbalized understanding.    Wallis Bamberg, PA-C 10/28/22 1426

## 2022-10-29 LAB — HIV ANTIBODY (ROUTINE TESTING W REFLEX): HIV Screen 4th Generation wRfx: NONREACTIVE

## 2022-10-29 LAB — CYTOLOGY, (ORAL, ANAL, URETHRAL) ANCILLARY ONLY
Chlamydia: NEGATIVE
Comment: NEGATIVE
Comment: NEGATIVE
Comment: NORMAL
Neisseria Gonorrhea: NEGATIVE
Trichomonas: NEGATIVE

## 2022-10-29 LAB — RPR: RPR Ser Ql: NONREACTIVE

## 2022-10-30 ENCOUNTER — Telehealth: Payer: Self-pay

## 2022-10-30 MED ORDER — HYDROCORTISONE 1 % EX CREA: TOPICAL_CREAM | CUTANEOUS | 0 refills | Status: DC

## 2022-10-30 MED ORDER — HYDROCORTISONE 1 % EX CREA: TOPICAL_CREAM | CUTANEOUS | 0 refills | Status: AC

## 2022-11-28 ENCOUNTER — Ambulatory Visit
Admission: RE | Admit: 2022-11-28 | Discharge: 2022-11-28 | Disposition: A | Discharge status: Home / Self Care | Payer: No Typology Code available for payment source | Source: Ambulatory Visit | Attending: Internal Medicine | Admitting: Internal Medicine

## 2022-11-28 VITALS — BP 146/98 | HR 87 | Temp 98.1°F | Resp 16

## 2022-11-28 DIAGNOSIS — B349 Viral infection, unspecified: Secondary | ICD-10-CM | POA: Insufficient documentation

## 2022-11-28 LAB — POCT INFLUENZA A/B
Influenza A, POC: NEGATIVE
Influenza B, POC: NEGATIVE

## 2022-11-28 MED ORDER — IBUPROFEN 600 MG PO TABS: 600.0000 mg | ORAL_TABLET | Freq: Four times a day (QID) | ORAL | 0 refills | Status: AC | PRN

## 2022-11-28 MED ORDER — PROMETHAZINE-DM 6.25-15 MG/5ML PO SYRP: 5.0000 mL | ORAL_SOLUTION | Freq: Three times a day (TID) | ORAL | 0 refills | Status: AC | PRN

## 2022-11-28 MED ORDER — CETIRIZINE HCL 10 MG PO TABS: 10.0000 mg | ORAL_TABLET | Freq: Every day | ORAL | 0 refills | Status: AC

## 2022-11-28 MED ORDER — PSEUDOEPHEDRINE HCL 60 MG PO TABS: 60.0000 mg | ORAL_TABLET | Freq: Three times a day (TID) | ORAL | 0 refills | Status: AC | PRN

## 2022-11-28 NOTE — Discharge Instructions (Signed)
We will notify you of your test results as they arrive and may take between about 24 hours.  I encourage you to sign up for MyChart if you have not already done so as this can be the easiest way for [place] to communicate results to you online or through a phone app.  Generally, we only contact you if it is a positive test result.  In the meantime, if you develop worsening symptoms including fever, chest pain, shortness of breath despite our current treatment plan then please report to the emergency room as this may be a sign of worsening status from possible viral infection.  Otherwise, we will manage this as a viral syndrome. For sore throat or cough try using a honey-based tea. Use 3 teaspoons of honey with juice squeezed from half lemon. Place shaved pieces of ginger into 1/2-1 cup of water and warm over stove top. Then mix the ingredients and repeat every 4 hours as needed. Please take Tylenol 500mg -650mg  every 6 hours for aches and pains, fevers. Hydrate very well with at least 2 liters of water. Eat light meals such as soups to replenish electrolytes and soft fruits, veggies. Start an antihistamine like Zyrtec (10mg  daily) for postnasal drainage, sinus congestion.  You can take this together with pseudoephedrine (Sudafed) at a dose of 60 mg 2-3 times a day as needed for the same kind of congestion.  Use the cough medications as needed.

## 2022-11-28 NOTE — ED Provider Notes (Signed)
[facility]  Note:  This document was prepared using Conservation officer, historic buildings and may include unintentional dictation errors.  MRN: [id number] DOB: [birth year]  Subjective:   Glenn Moyer is a 27 y.o. male presenting for 1 day history of acute onset of runny and stuffy nose, throat pain, coughing, hot and cold chills.  Patient had a similar symptoms that before it was COVID or flu.  He is requesting testing for this.  No overt chest pain, shortness of breath or wheezing.  No smoking of any kind including cigarettes, cigars, vaping, marijuana use.    No current facility-administered medications for this encounter.  Current Outpatient Medications:    levofloxacin (LEVAQUIN) 750 MG tablet, Take by mouth., Disp: , Rfl:    amLODipine (NORVASC) 10 MG tablet, Take 10 mg by mouth daily., Disp: , Rfl:    doxycycline (VIBRAMYCIN) 100 MG capsule, Take 1 capsule (100 mg total) by mouth 2 (two) times daily., Disp: 14 capsule, Rfl: 0   gabapentin (NEURONTIN) 300 MG capsule, TAKE ONE CAPSULE BY MOUTH AT BEDTIME TAKE ONE CAPSULE AT BEDTIME FOR CHRONIC PELVIC PAIN, Disp: , Rfl:    hydrocortisone (ANUSOL-HC) 2.5 % rectal cream, Place 1 Application rectally 2 (two) times daily., Disp: 30 g, Rfl: 0   hydrocortisone (ANUSOL-HC) 25 MG suppository, Place 1 suppository (25 mg total) rectally 2 (two) times daily., Disp: 12 suppository, Rfl: 0   hydrocortisone cream 1 %, Apply to affected area 2 times daily, Disp: 30 g, Rfl: 0   ibuprofen (ADVIL) 600 MG tablet, Take 1 tablet (600 mg total) by mouth every 6 (six) hours as needed., Disp: 30 tablet, Rfl: 0   metoprolol succinate (TOPROL-XL) 50 MG 24 hr tablet, Take 1 tablet by mouth at bedtime., Disp: , Rfl:    SUMAtriptan (IMITREX) 25 MG tablet, Take 25 mg by mouth every 2 (two) hours as needed for migraine. May repeat in 2 hours if headache persists or recurs., Disp: , Rfl:    Allergies  Allergen Reactions   Peanut-Containing  Drug Products Hives and Swelling    Past Medical History:  Diagnosis Date   Hypertension      History reviewed. No pertinent surgical history.  No family history on file.  Social History   Tobacco Use   Smoking status: Never   Smokeless tobacco: Never  Vaping Use   Vaping status: Never Used  Substance Use Topics   Alcohol use: Never   Drug use: Never    ROS   Objective:   Vitals: BP (!) 146/98 (BP Location: Right Arm)   Pulse 87   Temp 98.1 F (36.7 C) (Oral)   Resp 16   SpO2 98%   Physical Exam Constitutional:      General: He is not in acute distress.    Appearance: Normal appearance. He is well-developed and normal weight. He is not ill-appearing, toxic-appearing or diaphoretic.  HENT:     Head: Normocephalic and atraumatic.     Right Ear: Tympanic membrane, ear canal and external ear normal. No drainage, swelling or tenderness. No middle ear effusion. There is no impacted cerumen. Tympanic membrane is not erythematous or bulging.     Left Ear: Tympanic membrane, ear canal and external ear normal. No drainage, swelling or tenderness.  No middle ear effusion. There is no impacted cerumen. Tympanic membrane is not erythematous or bulging.     Nose: Nose normal. No congestion or rhinorrhea.     Mouth/Throat:  Mouth: Mucous membranes are moist.     Pharynx: No pharyngeal swelling, oropharyngeal exudate, posterior oropharyngeal erythema or uvula swelling.     Tonsils: No tonsillar exudate or tonsillar abscesses. 0 on the right. 0 on the left.  Eyes:     General: No scleral icterus.       Right eye: No discharge.        Left eye: No discharge.     Extraocular Movements: Extraocular movements intact.     Conjunctiva/sclera: Conjunctivae normal.  Cardiovascular:     Rate and Rhythm: Normal rate and regular rhythm.     Heart sounds: Normal heart sounds. No murmur heard.    No friction rub. No gallop.  Pulmonary:     Effort: Pulmonary effort is normal. No  respiratory distress.     Breath sounds: Normal breath sounds. No stridor. No wheezing, rhonchi or rales.  Musculoskeletal:     Cervical back: Normal range of motion and neck supple. No rigidity. No muscular tenderness.  Neurological:     General: No focal deficit present.     Mental Status: He is alert and oriented to person, place, and time.  Psychiatric:        Mood and Affect: Mood normal.        Behavior: Behavior normal.        Thought Content: Thought content normal.        Judgment: Judgment normal.     Results for orders placed or performed during the hospital encounter of 11/28/22 (from the past 24 hour(s))  POCT Influenza A/B     Status: None   Collection Time: 11/28/22 12:19 PM  Result Value Ref Range   Influenza A, POC Negative Negative   Influenza B, POC Negative Negative    Assessment and Plan :   PDMP not reviewed this encounter.  1. Acute viral syndrome    Deferred imaging given clear cardiopulmonary exam, hemodynamically stable vital signs.  Will manage for viral illness such as viral URI, viral syndrome, viral rhinitis, COVID-19. Recommended supportive care. Offered scripts for symptomatic relief. Testing is pending. Counseled patient on potential for adverse effects with medications prescribed/recommended today, ER and return-to-clinic precautions discussed, patient verbalized understanding.     Wallis Bamberg, PA-C 11/28/22 1555

## 2022-11-28 NOTE — ED Triage Notes (Signed)
Pt c/o cough, runny nose, sore throat, feeling hot and cold/did not take temp x today-no meds PTA-NAD-steady gait

## 2022-11-29 LAB — SARS CORONAVIRUS 2 (TAT 6-24 HRS): SARS Coronavirus 2: NEGATIVE

## 2023-02-02 ENCOUNTER — Ambulatory Visit
Admission: RE | Admit: 2023-02-02 | Discharge: 2023-02-02 | Disposition: A | Discharge status: Home / Self Care | Payer: No Typology Code available for payment source | Source: Ambulatory Visit | Attending: Family Medicine | Admitting: Family Medicine

## 2023-02-02 VITALS — BP 142/90 | HR 92 | Temp 97.9°F | Resp 16

## 2023-02-02 DIAGNOSIS — R519 Headache, unspecified: Secondary | ICD-10-CM | POA: Diagnosis not present

## 2023-02-02 DIAGNOSIS — N419 Inflammatory disease of prostate, unspecified: Secondary | ICD-10-CM

## 2023-02-02 DIAGNOSIS — R110 Nausea: Secondary | ICD-10-CM | POA: Diagnosis not present

## 2023-02-02 MED ORDER — ONDANSETRON 8 MG PO TBDP: 8.0000 mg | ORAL_TABLET | Freq: Three times a day (TID) | ORAL | 0 refills | Status: AC | PRN

## 2023-02-02 NOTE — ED Triage Notes (Signed)
Pt c/o nausea and HA sx started 2-3 days after a GI test at the [place] 1/17-reports hx of chronic HAs-NAD-steady gait

## 2023-02-02 NOTE — ED Provider Notes (Addendum)
[facility]  Note:  This document was prepared using Conservation officer, historic buildings and may include unintentional dictation errors.  MRN: [id number] DOB: 1995-10-03  Subjective:   Glenn Moyer is a 28 y.o. male presenting for 10-day history of persistent nausea without vomiting, not having persistent mild headaches.  Symptoms started after [facility].  Per patient colonoscopy results were within normal limits.  Previously has a history of migraines but feels that this is a different kind of headache.  He is actually taking levofloxacin for prostatitis.  Was initially taking doxycycline but got switched.  No fever, diarrhea, neck stiffness, neck pain, chest pain, vomiting, bloody stools.  No current facility-administered medications for this encounter.  Current Outpatient Medications:    amLODipine (NORVASC) 10 MG tablet, Take 10 mg by mouth daily., Disp: , Rfl:    cetirizine (ZYRTEC ALLERGY) 10 MG tablet, Take 1 tablet (10 mg total) by mouth daily., Disp: 30 tablet, Rfl: 0   doxycycline (VIBRAMYCIN) 100 MG capsule, Take 1 capsule (100 mg total) by mouth 2 (two) times daily., Disp: 14 capsule, Rfl: 0   gabapentin (NEURONTIN) 300 MG capsule, TAKE ONE CAPSULE BY MOUTH AT BEDTIME TAKE ONE CAPSULE AT BEDTIME FOR CHRONIC PELVIC PAIN, Disp: , Rfl:    hydrocortisone (ANUSOL-HC) 2.5 % rectal cream, Place 1 Application rectally 2 (two) times daily., Disp: 30 g, Rfl: 0   hydrocortisone (ANUSOL-HC) 25 MG suppository, Place 1 suppository (25 mg total) rectally 2 (two) times daily., Disp: 12 suppository, Rfl: 0   hydrocortisone cream 1 %, Apply to affected area 2 times daily, Disp: 30 g, Rfl: 0   ibuprofen (ADVIL) 600 MG tablet, Take 1 tablet (600 mg total) by mouth every 6 (six) hours as needed., Disp: 30 tablet, Rfl: 0   levofloxacin (LEVAQUIN) 750 MG tablet, Take by mouth., Disp: , Rfl:    metoprolol succinate (TOPROL-XL) 50 MG 24 hr  tablet, Take 1 tablet by mouth at bedtime., Disp: , Rfl:    promethazine-dextromethorphan (PROMETHAZINE-DM) 6.25-15 MG/5ML syrup, Take 5 mLs by mouth 3 (three) times daily as needed for cough., Disp: 200 mL, Rfl: 0   pseudoephedrine (SUDAFED) 60 MG tablet, Take 1 tablet (60 mg total) by mouth every 8 (eight) hours as needed for congestion., Disp: 30 tablet, Rfl: 0   SUMAtriptan (IMITREX) 25 MG tablet, Take 25 mg by mouth every 2 (two) hours as needed for migraine. May repeat in 2 hours if headache persists or recurs., Disp: , Rfl:    Allergies  Allergen Reactions   Peanut-Containing Drug Products Hives and Swelling    Past Medical History:  Diagnosis Date   Hypertension      History reviewed. No pertinent surgical history.  No family history on file.  Social History   Tobacco Use   Smoking status: Never   Smokeless tobacco: Never  Vaping Use   Vaping status: Never Used  Substance Use Topics   Alcohol use: Never   Drug use: Never    ROS   Objective:   Vitals: BP (!) 142/90 (BP Location: Right Arm)   Pulse 92   Temp 97.9 F (36.6 C) (Oral)   Resp 16   SpO2 98%   Physical Exam Constitutional:      General: He is not in acute distress.    Appearance: Normal appearance. He is well-developed and normal weight. He is not ill-appearing, toxic-appearing or diaphoretic.  HENT:     Head: Normocephalic  and atraumatic.     Right Ear: External ear normal.     Left Ear: External ear normal.     Nose: Nose normal.     Mouth/Throat:     Pharynx: Oropharynx is clear.  Eyes:     General: No scleral icterus.       Right eye: No discharge.        Left eye: No discharge.     Extraocular Movements: Extraocular movements intact.  Cardiovascular:     Rate and Rhythm: Normal rate.  Pulmonary:     Effort: Pulmonary effort is normal.  Abdominal:     General: Bowel sounds are normal. There is no distension.     Palpations: Abdomen is soft. There is no mass.     Tenderness: There  is no abdominal tenderness. There is no right CVA tenderness, left CVA tenderness, guarding or rebound.  Musculoskeletal:     Cervical back: Normal range of motion.  Neurological:     Mental Status: He is alert and oriented to person, place, and time.     Cranial Nerves: No cranial nerve deficit.     Motor: No weakness.     Coordination: Coordination normal.     Gait: Gait normal.     Deep Tendon Reflexes: Reflexes normal.  Psychiatric:        Mood and Affect: Mood normal.        Behavior: Behavior normal.        Thought Content: Thought content normal.        Judgment: Judgment normal.    Assessment and Plan :   PDMP not reviewed this encounter.  1. Nausea without vomiting   2. Generalized headaches    No signs of an acute abdomen.  Discussed differential and in this case I suspect that these are likely adverse effects from levofloxacin as opposed to adverse effects from the colonoscopy procedure.  Low suspicion for secondary infection with C. difficile as there is no diarrhea, bloody stools, fever, abdominal pain.  Headaches I believe are secondary to his decreased appetite from the nausea.  No signs of an acute encephalopathy.  Recommend supportive care and follow-up with his GI doctor and provider managing his prostatitis.  Counseled patient on potential for adverse effects with medications prescribed/recommended today, ER and return-to-clinic precautions discussed, patient verbalized understanding.    Wallis Bamberg, [place] 02/02/23 1347

## 2023-02-02 NOTE — Discharge Instructions (Addendum)
Make sure you push fluids drinking mostly water but mix it with Gatorade.  Try to eat light meals including soups, broths and soft foods, fruits.  You may use Zofran for your nausea and vomiting once every 8 hours.  Please return to the clinic if symptoms worsen or you start having severe abdominal pain not helped by taking Tylenol or start having bloody stools or blood in the vomit. Follow up with your prostate doctor to see if they would like to use a different medication than levofloxacin to help with your nausea.

## 2024-01-13 ENCOUNTER — Ambulatory Visit: Attending: Otolaryngology

## 2024-01-13 DIAGNOSIS — R0683 Snoring: Secondary | ICD-10-CM | POA: Diagnosis present
